# Patient Record
Sex: Male | Born: 1995 | Hispanic: Yes | Marital: Single | Smoking: Never smoker
Health system: Southern US, Community
[De-identification: ages and names within clinical notes are randomized; demographics above are authoritative.]

---

## 2019-09-01 ENCOUNTER — Other Ambulatory Visit: Payer: Self-pay

## 2019-09-01 ENCOUNTER — Encounter: Payer: Self-pay | Admitting: Emergency Medicine

## 2019-09-01 ENCOUNTER — Emergency Department (INDEPENDENT_AMBULATORY_CARE_PROVIDER_SITE_OTHER)
Admission: EM | Admit: 2019-09-01 | Discharge: 2019-09-01 | Disposition: A | Discharge status: Home / Self Care | Payer: Self-pay | Source: Home / Self Care | Attending: Family Medicine | Admitting: Family Medicine

## 2019-09-01 ENCOUNTER — Emergency Department (INDEPENDENT_AMBULATORY_CARE_PROVIDER_SITE_OTHER): Payer: Worker's Compensation

## 2019-09-01 DIAGNOSIS — M79672 Pain in left foot: Secondary | ICD-10-CM

## 2019-09-01 DIAGNOSIS — S29012A Strain of muscle and tendon of back wall of thorax, initial encounter: Secondary | ICD-10-CM

## 2019-09-01 DIAGNOSIS — M25511 Pain in right shoulder: Secondary | ICD-10-CM

## 2019-09-01 DIAGNOSIS — S93602A Unspecified sprain of left foot, initial encounter: Secondary | ICD-10-CM

## 2019-09-01 MED ORDER — IBUPROFEN 800 MG PO TABS: ORAL_TABLET | ORAL | 0 refills | Status: AC

## 2019-09-01 NOTE — ED Provider Notes (Signed)
[facility]    CSN: [id number] Arrival date & time: 09/01/19  1634      History   Chief Complaint Chief Complaint  Patient presents with  . Fall    HPI Hayden Cortez is a 24 y.o. male.   Patient presents for occupational injury. 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  He braced with his right arm, and twisted his left foot.  He subsequently had pain in his right shoulder and scapular area, and pain in his left foot.  The history is provided by the patient.  Shoulder Injury This is a new problem. Episode onset: 5 hours ago. The problem occurs constantly. The problem has not changed since onset.Pertinent negatives include no chest pain, no abdominal pain and no headaches. Exacerbated by: right shoulder movement. Nothing relieves the symptoms. He has tried nothing for the symptoms.  Foot Injury Location:  Foot Time since incident:  5 hours Injury: yes   Mechanism of injury: fall   Fall:    Impact surface:  Hard floor Foot location:  L foot Pain details:    Quality:  Aching   Radiates to:  Does not radiate   Severity:  Mild   Onset quality:  Sudden   Duration:  5 hours   Timing:  Constant   Progression:  Unchanged Chronicity:  New Prior injury to area:  No Relieved by:  None tried Worsened by:  Bearing weight Ineffective treatments:  None tried Associated symptoms: back pain, decreased ROM and stiffness   Associated symptoms: no muscle weakness, no neck pain, no numbness, no swelling and no tingling     History reviewed. No pertinent past medical history.  There are no problems to display for this patient.   History reviewed. No pertinent surgical history.     Home Medications    Prior to Admission medications   Medication Sig Start Date End Date Taking? Authorizing Provider  ibuprofen (ADVIL) 800 MG tablet Take one tab PO Q8hr with food 09/01/19   Lattie Haw, MD    Family History Family History   Problem Relation Age of Onset  . Healthy Mother   . Healthy Father     Social History Social History   Tobacco Use  . Smoking status: Never Smoker  . Smokeless tobacco: Never Used  Substance Use Topics  . Alcohol use: Not Currently  . Drug use: Not on file     Allergies   Patient has no known allergies.   Review of Systems Review of Systems  Constitutional: Negative.   HENT: Negative.   Eyes: Negative.   Respiratory: Negative.   Cardiovascular: Negative for chest pain.  Gastrointestinal: Negative for abdominal pain.  Genitourinary: Negative.   Musculoskeletal: Positive for back pain and stiffness. Negative for neck pain.       Right shoulder pain and left foot pain  Skin: Negative.   Neurological: Negative for headaches.     Physical Exam Triage Vital Signs ED Triage Vitals  Enc Vitals Group     BP 09/01/19 1654 113/73     Pulse Rate 09/01/19 1654 72     Resp --      Temp 09/01/19 1654 98 F (36.7 C)     Temp Source 09/01/19 1654 Oral     SpO2 09/01/19 1654 98 %     Weight 09/01/19 1655 245 lb (111.1 kg)     Height 09/01/19 1655 5\' 11"  (1.803 m)  Head Circumference --      Peak Flow --      Pain Score 09/01/19 1654 7     Pain Loc --      Pain Edu? --      Excl. in [place]? --    No data found.  Updated Vital Signs BP 113/73 (BP Location: Right Arm)   Pulse 72   Temp 98 F (36.7 C) (Oral)   Ht 5\' 11"  (1.803 m)   Wt 111.1 kg   SpO2 98%   BMI 34.17 kg/m   Visual Acuity Right Eye Distance:   Left Eye Distance:   Bilateral Distance:    Right Eye Near:   Left Eye Near:    Bilateral Near:     Physical Exam Vitals and nursing note reviewed.  Constitutional:      General: He is not in acute distress. HENT:     Head: Atraumatic.     Right Ear: External ear normal.     Left Ear: External ear normal.     Nose: Nose normal.  Eyes:     Pupils: Pupils are equal, round, and reactive to light.  Cardiovascular:     Heart sounds: Normal heart  sounds.  Pulmonary:     Breath sounds: Normal breath sounds.       Comments: There is distinct tenderness over medial and inferior edges of right scapula.  Pain elicited by resisted abduction of right shoulder while palpating right rhomboid muscles.   Abdominal:     Palpations: Abdomen is soft.     Tenderness: There is no abdominal tenderness.  Musculoskeletal:     Cervical back: Normal range of motion.       Feet:     Comments: Right shoulder has good internal/external rotation. Apley's test negative.  No tenderness to palpation   Feet:     Comments: Left ankle has full range of motion without tenderness to palpation.  Left foot has tenderness dorsally  as noted on diagram.   Skin:    General: Skin is warm and dry.  Neurological:     Mental Status: He is alert.      UC Treatments / Results  Labs (all labs ordered are listed, but only abnormal results are displayed) Labs Reviewed - No data to display  EKG   Radiology DG Shoulder Right  Result Date: 09/01/2019 CLINICAL DATA:  Status post fall. EXAM: RIGHT SHOULDER - 2+ VIEW COMPARISON:  None. FINDINGS: There is no evidence of fracture or dislocation. There is no evidence of arthropathy or other focal bone abnormality. Soft tissues are unremarkable. IMPRESSION: Negative. Electronically Signed   By: Virgina Norfolk M.D.   On: 09/01/2019 18:01   DG Foot Complete Left  Result Date: 09/01/2019 CLINICAL DATA:  Status post fall. EXAM: LEFT FOOT - COMPLETE 3+ VIEW COMPARISON:  None. FINDINGS: There is no evidence of fracture or dislocation. There is no evidence of arthropathy or other focal bone abnormality. Soft tissues are unremarkable. IMPRESSION: Negative. Electronically Signed   By: Virgina Norfolk M.D.   On: 09/01/2019 18:01    Procedures Procedures (including critical care time)  Medications Ordered in UC Medications - No data to display  Initial Impression / Assessment and Plan / UC Course  I have reviewed the  triage vital signs and the nursing notes.  Pertinent labs & imaging results that were available during my care of the patient were reviewed by me and considered in my medical decision making (see chart for  details).    Rx for Motrin. Followup with [facility] in 3 days.   Final Clinical Impressions(s) / UC Diagnoses   Final diagnoses:  Sprain of left foot, initial encounter  Rhomboid muscle strain, initial encounter     Discharge Instructions     Apply ice pack for 20 to 30 minutes, 3 to 4 times daily  Continue until pain and swelling decrease.   Begin range of motion and stretching exercises.    ED Prescriptions    Medication Sig Dispense Auth. Provider   ibuprofen (ADVIL) 800 MG tablet Take one tab PO Q8hr with food 21 tablet Lattie Haw, MD        Lattie Haw, MD 09/01/19 [phone]

## 2019-09-01 NOTE — Discharge Instructions (Addendum)
Apply ice pack for 20 to 30 minutes, 3 to 4 times daily  Continue until pain and swelling decrease.  Begin range of motion and stretching exercises. °

## 2019-09-01 NOTE — ED Triage Notes (Signed)
Fell at work today on [employer] hurting LT ankle and RT shoulder

## 2019-09-16 ENCOUNTER — Other Ambulatory Visit: Payer: Self-pay

## 2021-10-21 IMAGING — DX DG SHOULDER 2+V*R*
3 series · 3 of 3 positions shown · non-contrast
Comparison: None.

CLINICAL DATA: Status post fall.

EXAM:
RIGHT SHOULDER - 2+ VIEW

[shoulder grashey]
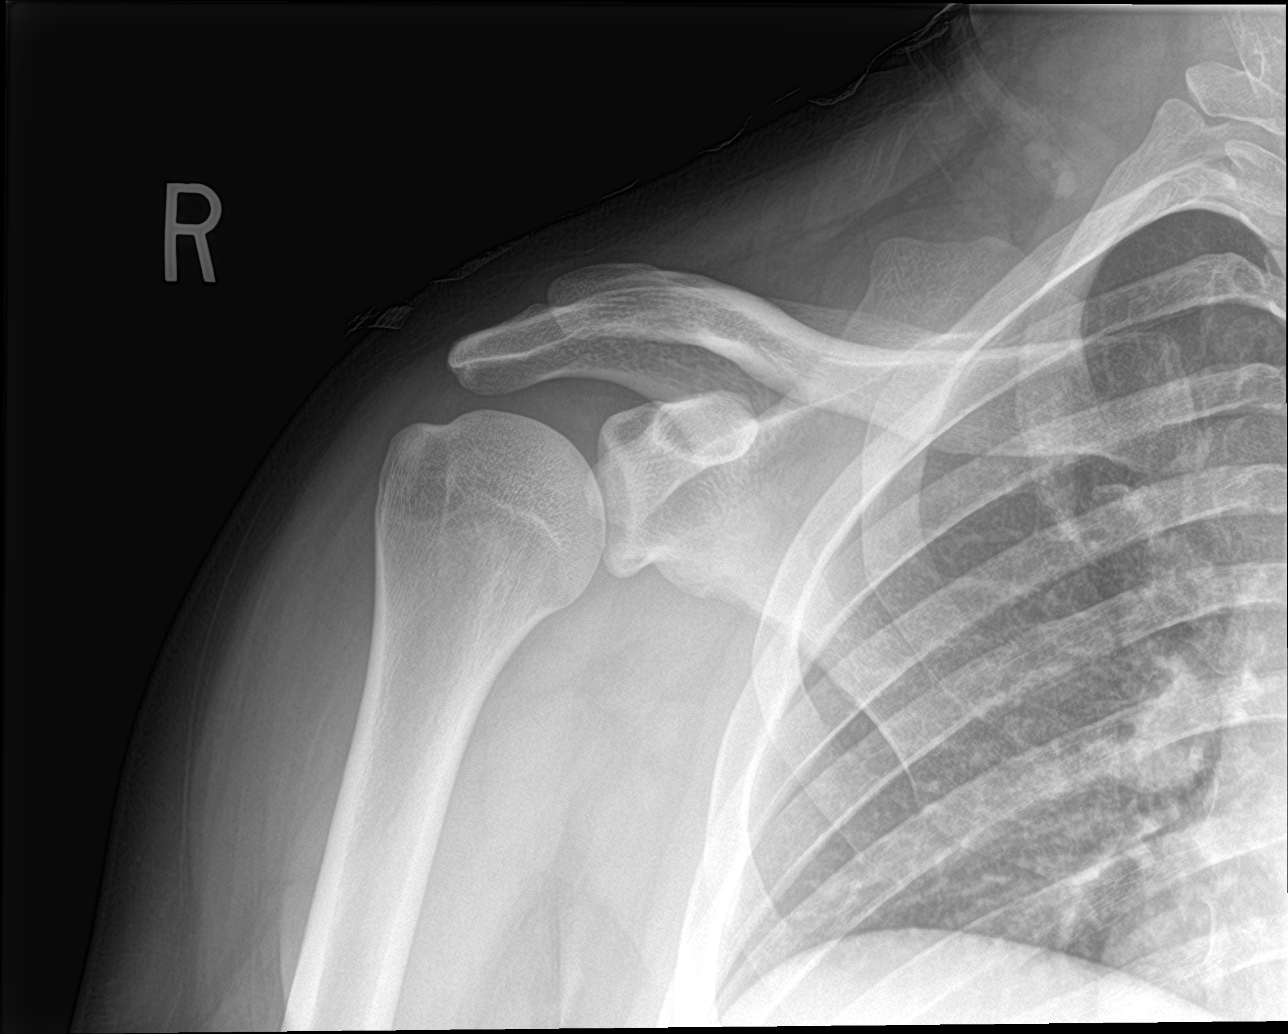

[shoulder y view]
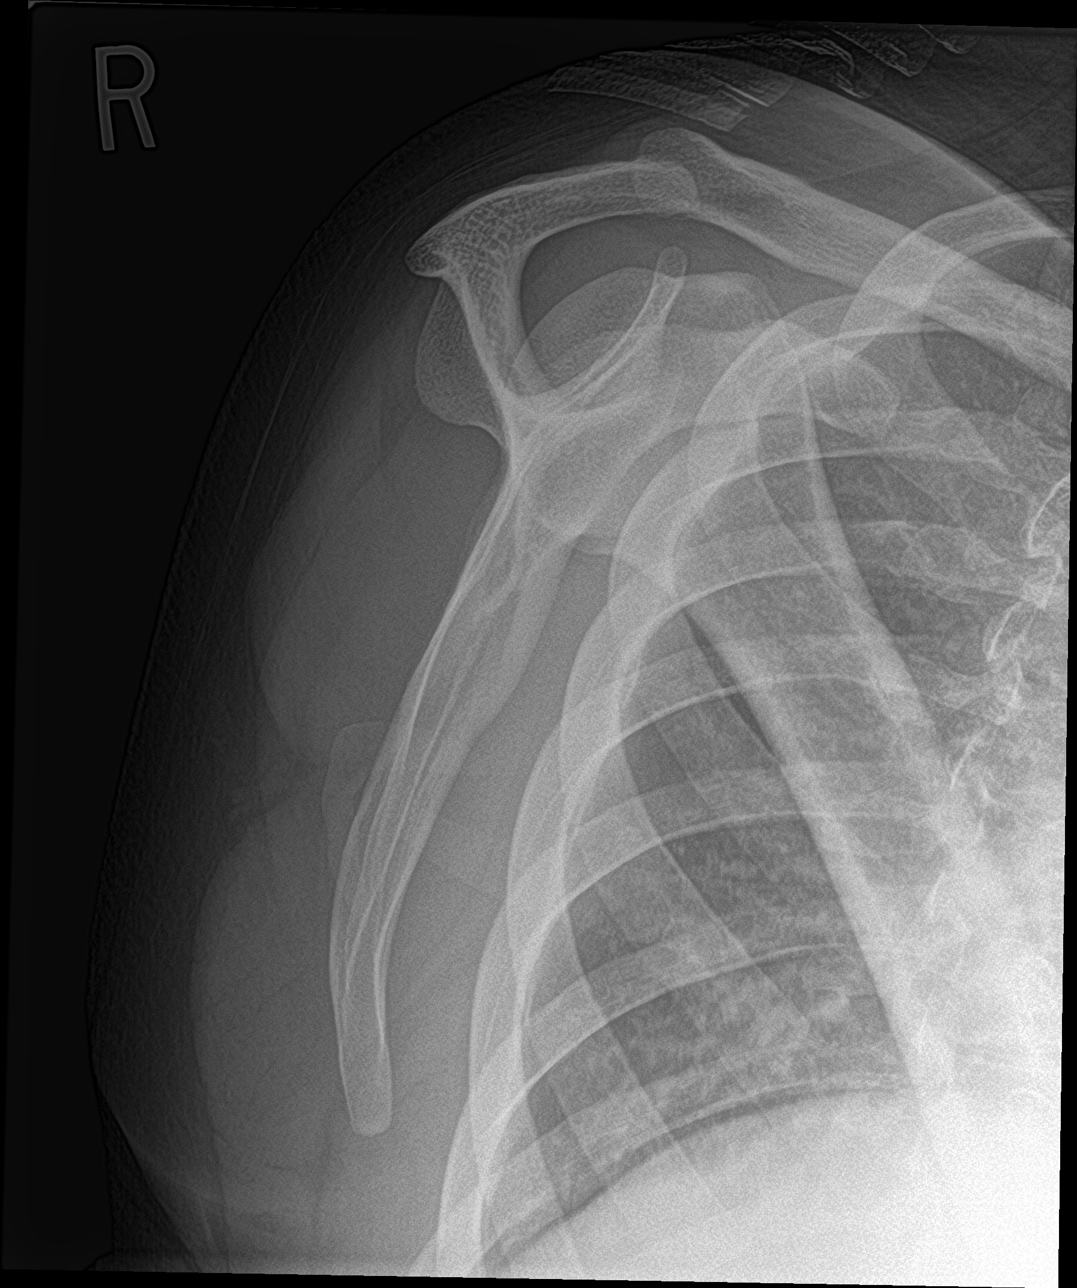

[shoulder axillary]
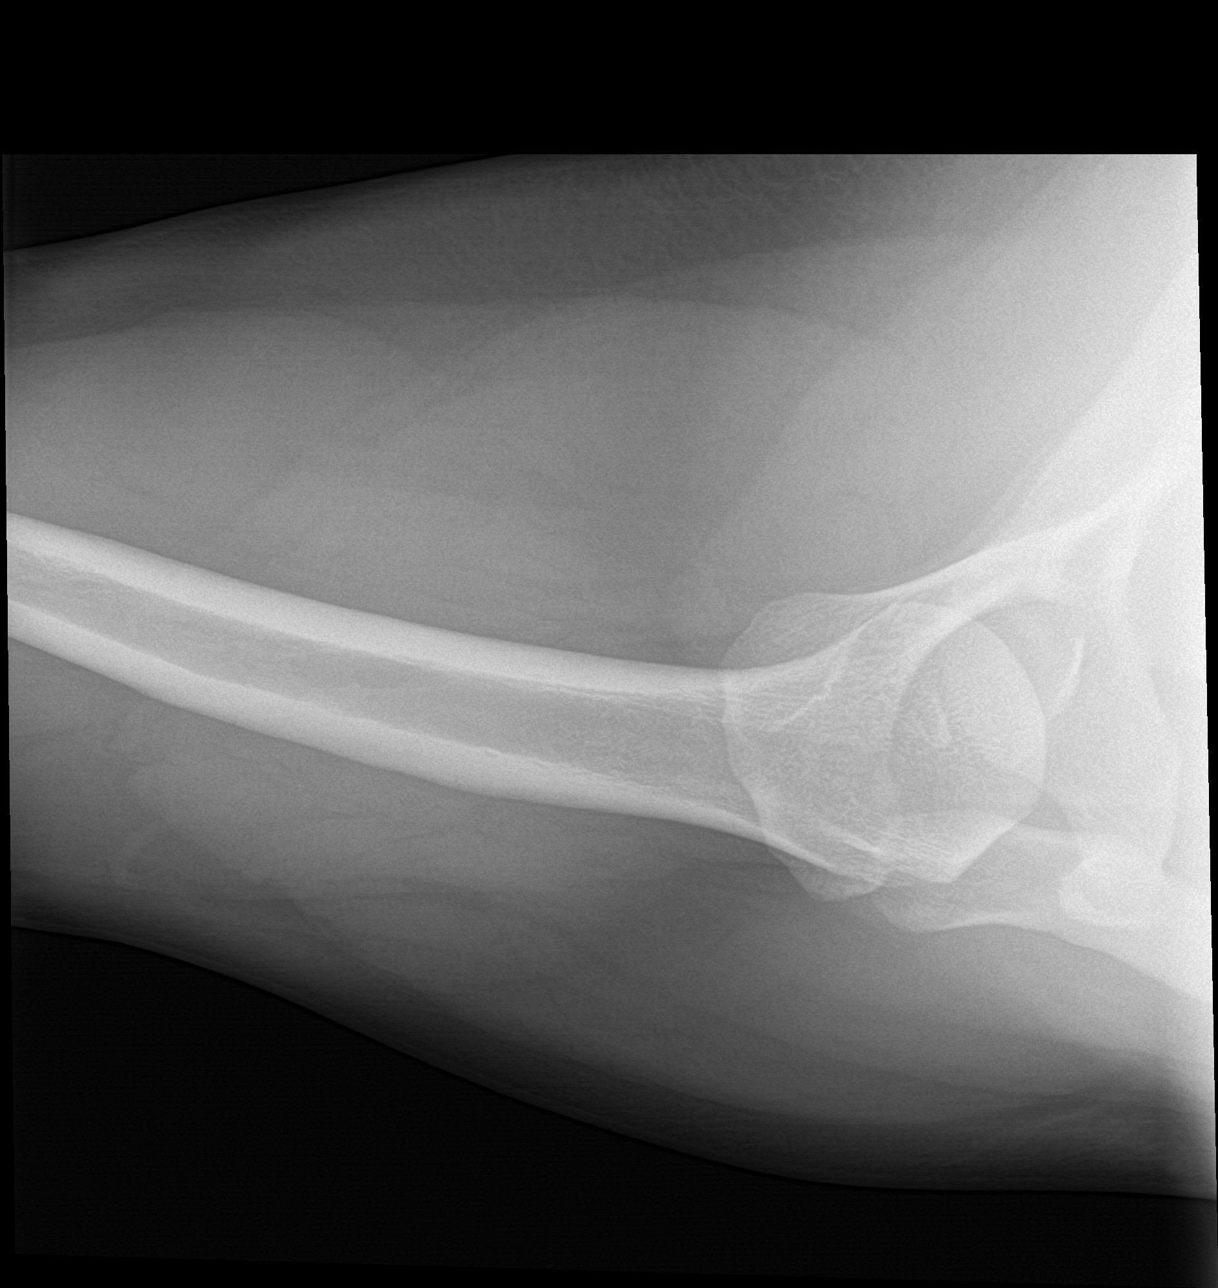

[3 of 3 positions shown; findings below may reference images not displayed]

FINDINGS: There is no evidence of fracture or dislocation. There is no
evidence of arthropathy or other focal bone abnormality. Soft
tissues are unremarkable.
IMPRESSION: Negative.

## 2021-12-01 ENCOUNTER — Emergency Department (HOSPITAL_BASED_OUTPATIENT_CLINIC_OR_DEPARTMENT_OTHER)
Admission: EM | Admit: 2021-12-01 | Discharge: 2021-12-01 | Disposition: A | Discharge status: Home / Self Care | Payer: Worker's Compensation | Attending: Emergency Medicine | Admitting: Emergency Medicine

## 2021-12-01 ENCOUNTER — Other Ambulatory Visit: Payer: Self-pay

## 2021-12-01 ENCOUNTER — Encounter (HOSPITAL_BASED_OUTPATIENT_CLINIC_OR_DEPARTMENT_OTHER): Payer: Self-pay

## 2021-12-01 DIAGNOSIS — Z2914 Encounter for prophylactic rabies immune globin: Secondary | ICD-10-CM | POA: Insufficient documentation

## 2021-12-01 DIAGNOSIS — S81852A Open bite, left lower leg, initial encounter: Secondary | ICD-10-CM | POA: Insufficient documentation

## 2021-12-01 DIAGNOSIS — W540XXA Bitten by dog, initial encounter: Secondary | ICD-10-CM | POA: Diagnosis not present

## 2021-12-01 DIAGNOSIS — Z203 Contact with and (suspected) exposure to rabies: Secondary | ICD-10-CM | POA: Insufficient documentation

## 2021-12-01 DIAGNOSIS — Z23 Encounter for immunization: Secondary | ICD-10-CM | POA: Diagnosis not present

## 2021-12-01 MED ORDER — RABIES VACCINE, PCEC IM SUSR
1.0000 mL | Freq: Once | INTRAMUSCULAR | Status: AC
  Administered 2021-12-01: 1 mL via INTRAMUSCULAR
  Filled 2021-12-01 (×2): qty 1

## 2021-12-01 MED ORDER — RABIES IMMUNE GLOBULIN 150 UNIT/ML IM INJ
20.0000 [IU]/kg | INJECTION | Freq: Once | INTRAMUSCULAR | Status: AC
  Administered 2021-12-01: 2475 [IU] via INTRAMUSCULAR
  Filled 2021-12-01: qty 18

## 2021-12-04 ENCOUNTER — Encounter (HOSPITAL_BASED_OUTPATIENT_CLINIC_OR_DEPARTMENT_OTHER): Payer: Self-pay | Admitting: Emergency Medicine

## 2021-12-04 ENCOUNTER — Emergency Department (HOSPITAL_BASED_OUTPATIENT_CLINIC_OR_DEPARTMENT_OTHER)
Admission: EM | Admit: 2021-12-04 | Discharge: 2021-12-04 | Disposition: A | Discharge status: Home / Self Care | Payer: Worker's Compensation | Attending: Emergency Medicine | Admitting: Emergency Medicine

## 2021-12-04 ENCOUNTER — Other Ambulatory Visit: Payer: Self-pay

## 2021-12-04 DIAGNOSIS — S7012XD Contusion of left thigh, subsequent encounter: Secondary | ICD-10-CM | POA: Insufficient documentation

## 2021-12-04 DIAGNOSIS — Z23 Encounter for immunization: Secondary | ICD-10-CM | POA: Diagnosis not present

## 2021-12-04 DIAGNOSIS — Z203 Contact with and (suspected) exposure to rabies: Secondary | ICD-10-CM | POA: Insufficient documentation

## 2021-12-04 DIAGNOSIS — S79922D Unspecified injury of left thigh, subsequent encounter: Secondary | ICD-10-CM | POA: Diagnosis present

## 2021-12-04 DIAGNOSIS — W540XXD Bitten by dog, subsequent encounter: Secondary | ICD-10-CM | POA: Diagnosis not present

## 2021-12-04 MED ORDER — RABIES VACCINE, PCEC IM SUSR
1.0000 mL | Freq: Once | INTRAMUSCULAR | Status: AC
  Administered 2021-12-04: 1 mL via INTRAMUSCULAR
  Filled 2021-12-04: qty 1

## 2021-12-11 ENCOUNTER — Encounter (HOSPITAL_BASED_OUTPATIENT_CLINIC_OR_DEPARTMENT_OTHER): Payer: Self-pay

## 2021-12-11 ENCOUNTER — Emergency Department (HOSPITAL_BASED_OUTPATIENT_CLINIC_OR_DEPARTMENT_OTHER)
Admission: EM | Admit: 2021-12-11 | Discharge: 2021-12-12 | Disposition: A | Discharge status: Home / Self Care | Payer: Medicaid Other | Attending: Emergency Medicine | Admitting: Emergency Medicine

## 2021-12-11 ENCOUNTER — Other Ambulatory Visit: Payer: Self-pay

## 2021-12-11 DIAGNOSIS — Z203 Contact with and (suspected) exposure to rabies: Secondary | ICD-10-CM | POA: Insufficient documentation

## 2021-12-11 DIAGNOSIS — Z23 Encounter for immunization: Secondary | ICD-10-CM | POA: Insufficient documentation

## 2021-12-11 DIAGNOSIS — W540XXD Bitten by dog, subsequent encounter: Secondary | ICD-10-CM

## 2021-12-11 NOTE — ED Triage Notes (Signed)
Patient here POV from Home.  Endorses needing 3rd Rabies Immunization. No New Complaints.  NAD Noted during Triage. A&Ox4. GCS 15. Ambulatory.

## 2021-12-12 MED ORDER — RABIES VACCINE, PCEC IM SUSR
1.0000 mL | Freq: Once | INTRAMUSCULAR | Status: AC
  Administered 2021-12-12: 1 mL via INTRAMUSCULAR
  Filled 2021-12-12: qty 1

## 2021-12-12 NOTE — ED Provider Notes (Signed)
[facility] EMERGENCY DEPT Provider Note  CSN: [id number] Arrival date & time: 12/11/21 2134  Chief Complaint(s) Rabies Injection  HPI Hayden Cortez is a 26 y.o. male who was bit by a [place] on 6/23 requiring rabies vaccine here for his third dose.  Patient has no physical complaints  HPI  Past Medical History History reviewed. No pertinent past medical history. There are no problems to display for this patient.  Home Medication(s) Prior to Admission medications   Medication Sig Start Date End Date Taking? Authorizing Provider  ibuprofen (ADVIL) 800 MG tablet Take one tab PO Q8hr with food 09/01/19   Lattie Haw, MD                                                                                                                                    Allergies Patient has no known allergies.  Review of Systems Review of Systems As noted in HPI  Physical Exam Vital Signs  I have reviewed the triage vital signs BP (!) 146/99 (BP Location: Right Arm)   Pulse 77   Temp 97.6 F (36.4 C) (Temporal)   Resp 16   Ht 5\' 11"  (1.803 m)   Wt 123.4 kg   SpO2 99%   BMI 37.94 kg/m   Physical Exam Vitals reviewed.  Constitutional:      General: He is not in acute distress.    Appearance: He is well-developed. He is not diaphoretic.  HENT:     Head: Normocephalic and atraumatic.     Right Ear: External ear normal.     Left Ear: External ear normal.     Nose: Nose normal.     Mouth/Throat:     Mouth: Mucous membranes are moist.  Eyes:     General: No scleral icterus.    Conjunctiva/sclera: Conjunctivae normal.  Neck:     Trachea: Phonation normal.  Cardiovascular:     Rate and Rhythm: Normal rate and regular rhythm.  Pulmonary:     Effort: Pulmonary effort is normal. No respiratory distress.     Breath sounds: No stridor.  Abdominal:     General: There is no distension.  Musculoskeletal:        General: Normal range of motion.     Cervical back:  Normal range of motion.  Neurological:     Mental Status: He is alert and oriented to person, place, and time.  Psychiatric:        Behavior: Behavior normal.     ED Results and Treatments Labs (all labs ordered are listed, but only abnormal results are displayed) Labs Reviewed - No data to display  EKG  EKG Interpretation  Date/Time:    Ventricular Rate:    PR Interval:    QRS Duration:   QT Interval:    QTC Calculation:   R Axis:     Text Interpretation:         Radiology No results found.  Pertinent labs & imaging results that were available during my care of the patient were reviewed by me and considered in my medical decision making (see MDM for details).  Medications Ordered in ED Medications  rabies vaccine (RABAVERT) injection 1 mL (has no administration in time range)                                                                                                                                     Procedures Procedures  (including critical care time)  Medical Decision Making / ED Course    ED Course:    Assessment, Add'l Intervention, and Reassessment: Here for rabies vaccine for prior dog bit Given    Final Clinical Impression(s) / ED Diagnoses Final diagnoses:  Dog bite, subsequent encounter   The patient appears reasonably screened and/or stabilized for discharge and I doubt any other medical condition or other [facility] requiring further screening, evaluation, or treatment in the ED at this time prior to discharge. Safe for discharge with strict return precautions.  Disposition: Discharge  Condition: Good  I have discussed the results, Dx and Tx plan with the patient/family who expressed understanding and agree(s) with the plan. Discharge instructions discussed at length. The patient/family was given strict return precautions who  verbalized understanding of the instructions. No further questions at time of discharge.    ED Discharge Orders     None              This chart was dictated using voice recognition software.  Despite best efforts to proofread,  errors can occur which can change the documentation meaning.    Nira Conn, MD 12/12/21 0003

## 2021-12-15 ENCOUNTER — Ambulatory Visit (HOSPITAL_COMMUNITY)
Admission: EM | Admit: 2021-12-15 | Discharge: 2021-12-15 | Disposition: A | Discharge status: Home / Self Care | Payer: Worker's Compensation | Attending: Internal Medicine | Admitting: Internal Medicine

## 2021-12-15 DIAGNOSIS — Z203 Contact with and (suspected) exposure to rabies: Secondary | ICD-10-CM

## 2021-12-15 MED ORDER — RABIES VACCINE, PCEC IM SUSR
1.0000 mL | Freq: Once | INTRAMUSCULAR | Status: AC
  Administered 2021-12-15: 1 mL via INTRAMUSCULAR

## 2021-12-15 MED ORDER — RABIES VACCINE, PCEC IM SUSR
INTRAMUSCULAR | Status: AC
  Filled 2021-12-15: qty 1

## 2021-12-15 NOTE — ED Triage Notes (Signed)
Pt here for last rabies shot
